# Patient Record
Sex: Female | Born: 1980 | Race: White | Hispanic: Yes | Smoking: Never smoker
Health system: Southern US, Community
[De-identification: ages and names within clinical notes are randomized; demographics above are authoritative.]

## PROBLEM LIST (undated history)

## (undated) DIAGNOSIS — Z789 Other specified health status: Secondary | ICD-10-CM

## (undated) HISTORY — DX: Other specified health status: Z78.9

## (undated) HISTORY — PX: OTHER SURGICAL HISTORY: SHX169

---

## 2006-04-19 ENCOUNTER — Other Ambulatory Visit: Payer: Self-pay

## 2006-04-19 ENCOUNTER — Emergency Department: Payer: Self-pay | Admitting: Emergency Medicine
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
## 2019-07-22 ENCOUNTER — Ambulatory Visit: Payer: Self-pay | Attending: Internal Medicine

## 2019-07-22 DIAGNOSIS — Z23 Encounter for immunization: Secondary | ICD-10-CM

## 2019-07-22 NOTE — Progress Notes (Signed)
   Covid-19 Vaccination Clinic  Name:  Lucila Maine    MRN: [id number] DOB: Aug 25, 1980  07/22/2019  Ms. Richardo Hanks was observed post Covid-19 immunization for 15 minutes without incident. She was provided with Vaccine Information Sheet and instruction to access the V-Safe system.   Ms. Richardo Hanks was instructed to call 911 with any severe reactions post vaccine: Marland Kitchen Difficulty breathing  . Swelling of face and throat  . A fast heartbeat  . A bad rash all over body  . Dizziness and weakness   Immunizations Administered    Name Date Dose VIS Date Route   Pfizer COVID-19 Vaccine 07/22/2019  6:23 PM 0.3 mL 04/13/2019 Intramuscular   Manufacturer: [employer], [employer]   Lot: [id number]   NDC: [address]-7

## 2019-08-12 ENCOUNTER — Ambulatory Visit: Payer: Self-pay | Attending: Internal Medicine

## 2019-08-12 DIAGNOSIS — Z23 Encounter for immunization: Secondary | ICD-10-CM

## 2019-08-12 NOTE — Progress Notes (Signed)
   Covid-19 Vaccination Clinic  Name:  Janet Foster    MRN: [id number] DOB: 10/04/1980  08/12/2019  Ms. Janet Foster was observed post Covid-19 immunization for 15 minutes without incident. She was provided with Vaccine Information Sheet and instruction to access the V-Safe system.   Ms. Janet Foster was instructed to call 911 with any severe reactions post vaccine: Marland Kitchen Difficulty breathing  . Swelling of face and throat  . A fast heartbeat  . A bad rash all over body  . Dizziness and weakness   Immunizations Administered    Name Date Dose VIS Date Route   Pfizer COVID-19 Vaccine 08/12/2019  4:43 PM 0.3 mL 04/13/2019 Intramuscular   Manufacturer: [employer], [employer]   Lot: [id number]   NDC: [address]-3

## 2020-01-03 ENCOUNTER — Ambulatory Visit: Payer: Self-pay

## 2020-01-03 ENCOUNTER — Other Ambulatory Visit: Payer: Self-pay

## 2020-01-03 ENCOUNTER — Encounter: Payer: Self-pay | Admitting: Advanced Practice Midwife

## 2020-01-03 ENCOUNTER — Ambulatory Visit (LOCAL_COMMUNITY_HEALTH_CENTER): Payer: Self-pay | Admitting: Advanced Practice Midwife

## 2020-01-03 VITALS — BP 124/86 | Ht 59.5 in | Wt 126.0 lb

## 2020-01-03 DIAGNOSIS — Z3009 Encounter for other general counseling and advice on contraception: Secondary | ICD-10-CM

## 2020-01-03 LAB — WET PREP FOR TRICH, YEAST, CLUE
Trichomonas Exam: NEGATIVE
Yeast Exam: NEGATIVE

## 2020-01-03 MED ORDER — NORGESTIM-ETH ESTRAD TRIPHASIC 0.18/0.215/0.25 MG-25 MCG PO TABS: 1.0000 | ORAL_TABLET | Freq: Every day | ORAL | 5 refills | Status: AC

## 2020-01-03 NOTE — Progress Notes (Signed)
[facility] [address] Main Number: [phone]    Family Planning Visit- Initial Visit  Subjective:  Janet Foster is a 39 y.o. SHF nonsmoker G2P2002 (12,9)  being seen today for an initial well woman visit and to discuss family planning options.  She is currently using Condoms for pregnancy prevention. Patient reports she does not want a pregnancy in the next year.  Patient has the following medical conditions does not have a problem list on file.  Chief Complaint  Patient presents with  . Annual Exam    Patient reports LMP 01/02/20.  Last sex 12/15/19 with condomo; with current partner x 2 years; 1 sex partner in last 3 mo.  Can't remember last pap.  Been in [place] x 17 years.  Working 38-40 hrs/wk.  Living with her 2 daughters. 129/84  Patient denies cigs, ETOH, vaping.   Body mass index is 25.02 kg/m. - Patient is eligible for diabetes screening based on BMI and age >11?  not applicable HA1C ordered? not applicable  Patient reports 1 of partners in last year. Desires STI screening?  Yes  Has patient been screened once for HCV in the past?  No  No results found for: HCVAB  Does the patient have current drug use (including MJ), have a partner with drug use, and/or has been incarcerated since last result? No  If yes-- Screen for HCV through [facility]   Does the patient meet criteria for HBV testing? No  Criteria:  -Household, sexual or needle sharing contact with HBV -History of drug use -HIV positive -Those with known Hep C   Health Maintenance Due  Topic Date Due  . Hepatitis C Screening  Never done  . COVID-19 Vaccine (1) Never done  . HIV Screening  Never done  . TETANUS/TDAP  Never done  . PAP SMEAR-Modifier  Never done  . INFLUENZA VACCINE  Never done    Review of Systems  All other systems reviewed and are negative.   The following portions of the patient's history were reviewed and  updated as appropriate: allergies, current medications, past family history, past medical history, past social history, past surgical history and problem list. Problem list updated.   See flowsheet for other program required questions.  Objective:   Vitals:   01/03/20 1611  BP: 124/86  Weight: 126 lb (57.2 kg)  Height: 4' 11.5" (1.511 m)    Physical Exam Constitutional:      Appearance: Normal appearance. She is normal weight.  HENT:     Head: Normocephalic and atraumatic.     Mouth/Throat:     Mouth: Mucous membranes are moist.  Eyes:     Conjunctiva/sclera: Conjunctivae normal.  Cardiovascular:     Rate and Rhythm: Normal rate and regular rhythm.  Pulmonary:     Effort: Pulmonary effort is normal.     Breath sounds: Normal breath sounds.  Chest:     Breasts:        Right: Normal.        Left: Normal.  Abdominal:     Palpations: Abdomen is soft.     Comments: Soft without tenderness  Genitourinary:    General: Normal vulva.     Exam position: Lithotomy position.     Vagina: Bleeding (red menses blood, ph>4.5) present.     Cervix: Normal.     Uterus: Normal.      Adnexa: Right adnexa normal and left adnexa normal.  Rectum: Normal.  Musculoskeletal:        General: Normal range of motion.     Cervical back: Normal range of motion and neck supple.  Skin:    General: Skin is warm and dry.  Neurological:     Mental Status: She is alert.  Psychiatric:        Mood and Affect: Mood normal.       Assessment and Plan:  Janet Foster is a 39 y.o. female presenting to the [facility] Department for an initial well woman exam/family planning visit  Contraception counseling: Reviewed all forms of birth control options in the tiered based approach. available including abstinence; over the counter/barrier methods; hormonal contraceptive medication including pill, patch, ring, injection,contraceptive implant, ECP; hormonal and nonhormonal IUDs; permanent  sterilization options including vasectomy and the various tubal sterilization modalities. Risks, benefits, and typical effectiveness rates were reviewed.  Questions were answered.  Written information was also given to the patient to review.  Patient desires ocp's, this was prescribed for patient. She will follow up in 6 months for surveillance.  She was told to call with any further questions, or with any concerns about this method of contraception.  Emphasized use of condoms 100% of the time for STI prevention.  Patient was offered ECP. ECP was not accepted by the patient. ECP counseling was not given - see RN documentation  1. Family planning Tri Sprintec Lo #6 to begin taking tomorrow am Please counsel pt abstinance/back up condoms next 7 days Need to check BP before giving #6 more Tri Sprintec Lo Treat wet mount per standing orders Immunization nurse consult - WET PREP FOR TRICH, YEAST, CLUE - Chlamydia/Gonorrhea [facility] - IGP, Aptima HPV     No follow-ups on file.  No future appointments.  Alberteen Spindle, CNM

## 2020-01-03 NOTE — Progress Notes (Signed)
Total of 6 packs ocps dispensed as ordered (2 different lot and expiration numbers). Wet mount negative. Jossie Ng, RN

## 2020-01-08 LAB — IGP, APTIMA HPV
HPV Aptima: NEGATIVE
PAP Smear Comment: 0

## 2020-06-27 ENCOUNTER — Ambulatory Visit: Payer: Self-pay
